# Patient Record
Sex: Female | Born: 1965 | Race: Black or African American | Hispanic: No | Marital: Married
Health system: Midwestern US, Community
[De-identification: ages and names within clinical notes are randomized; demographics above are authoritative.]

## PROBLEM LIST (undated history)

## (undated) DIAGNOSIS — M5136 Other intervertebral disc degeneration, lumbar region: Secondary | ICD-10-CM

---

## 2016-04-23 ENCOUNTER — Encounter

## 2016-05-05 ENCOUNTER — Inpatient Hospital Stay: Payer: TRICARE (CHAMPUS) | Primary: Family Medicine

## 2016-05-05 DIAGNOSIS — M5136 Other intervertebral disc degeneration, lumbar region: Secondary | ICD-10-CM

## 2016-05-10 ENCOUNTER — Ambulatory Visit: Payer: TRICARE (CHAMPUS) | Primary: Family Medicine

## 2016-06-09 ENCOUNTER — Inpatient Hospital Stay: Admit: 2016-06-09 | Payer: TRICARE (CHAMPUS) | Primary: Family Medicine

## 2016-06-09 ENCOUNTER — Encounter

## 2016-06-09 DIAGNOSIS — M5136 Other intervertebral disc degeneration, lumbar region: Secondary | ICD-10-CM

## 2016-06-09 MED ORDER — SODIUM CHLORIDE 0.9 % IV
Freq: Once | INTRAVENOUS | Status: AC
Start: 2016-06-09 — End: 2016-06-09
  Administered 2016-06-09: 15:00:00 via INTRAVENOUS

## 2016-06-09 MED ORDER — FENTANYL CITRATE (PF) 50 MCG/ML IJ SOLN
50 mcg/mL | Freq: Once | INTRAMUSCULAR | Status: AC
Start: 2016-06-09 — End: 2016-06-09
  Administered 2016-06-09: 15:00:00 via INTRAVENOUS

## 2016-06-09 MED ORDER — ALPRAZOLAM 0.5 MG TAB
0.5 mg | Freq: Once | ORAL | Status: AC
Start: 2016-06-09 — End: 2016-06-09
  Administered 2016-06-09: 14:00:00 via ORAL

## 2016-06-09 MED ORDER — MIDAZOLAM 1 MG/ML IJ SOLN
1 mg/mL | Freq: Once | INTRAMUSCULAR | Status: AC
Start: 2016-06-09 — End: 2016-06-09
  Administered 2016-06-09: 15:00:00 via INTRAVENOUS

## 2016-06-09 MED FILL — MIDAZOLAM 1 MG/ML IJ SOLN: 1 mg/mL | INTRAMUSCULAR | Qty: 5

## 2016-06-09 MED FILL — ALPRAZOLAM 0.5 MG TAB: 0.5 mg | ORAL | Qty: 1

## 2016-06-09 MED FILL — FENTANYL CITRATE (PF) 50 MCG/ML IJ SOLN: 50 mcg/mL | INTRAMUSCULAR | Qty: 2

## 2016-06-09 MED FILL — SODIUM CHLORIDE 0.9 % IV: INTRAVENOUS | Qty: 500

## 2016-06-09 NOTE — Progress Notes (Signed)
The patient was monitored by the pulse ox and nurse at side. She had continuous oxygen at 2 liters per nasal canula. She tolerated the mri well without any repeat of images. Omari Mcmanaway RN
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
## 2016-06-09 NOTE — Progress Notes (Signed)
The patient was sedated with xanax 0.5 mg po, versed 2.5 mg iv and fentanyl 50 mcg iv. She was discharged in a wheelchair with written discharge instructions after eating and drinking. She tolerated the mri well and was happy with the procedure. Micael HampshirePatricia Avary Pitsenbarger RN

## 2016-06-09 NOTE — H&P (Signed)
Interventional Radiology History and Physical (Outpatient)    06/09/2016    Patient: Elon SpannerNancy Caprio 51 y.o. female     Referring Physician:  Idelle CrouchSnyder, John W, MD    Chief Complaint: claustrophobia    History of Present Illness: needs mri spine with iv sedation    History:  Past Medical History:   Diagnosis Date   ??? Chronic pain    ??? Hypertension    ??? Migraine      No family history on file.  Social History     Social History   ??? Marital status: MARRIED     Spouse name: N/A   ??? Number of children: N/A   ??? Years of education: N/A     Occupational History   ??? Not on file.     Social History Main Topics   ??? Smoking status: Never Smoker   ??? Smokeless tobacco: Not on file   ??? Alcohol use Not on file   ??? Drug use: Not on file   ??? Sexual activity: Not on file     Other Topics Concern   ??? Not on file     Social History Narrative   ??? No narrative on file       Allergies: No Known Allergies    Prior to Admission Medications:  Prior to Admission medications    Medication Sig Start Date End Date Taking? Authorizing Provider   fluticasone (FLONASE) 50 mcg/actuation nasal spray 2 Sprays by Both Nostrils route daily.   Yes Historical Provider   telmisartan-hydroCHLOROthiazide (MICARDIS HCT) 80-25 mg per tablet Take 1 Tab by mouth daily.   Yes Historical Provider   sertraline (ZOLOFT) 100 mg tablet Take 100 mg by mouth daily.   Yes Historical Provider   ASPIRIN/SALICYLAMIDE/CAFFEINE (BC HEADACHE POWDER PO) Take  by mouth. Pt states she takes 3-4 a day.   Yes Historical Provider       Physical Exam:    Temperature 98.2 ??F (36.8 ??C), resp. rate 15, height 4\' 11"  (1.499 m), weight 67.1 kg (148 lb), SpO2 99 %.  General: alert, cooperative, no distress, appears stated age  Heart: normal S1 and S2  Lungs: clear to auscultation bilaterally      Plan of Care/Planned Procedure:  Risks, benefits, and alternatives reviewed with patient and she agrees to proceed with the procedure.     Cherre Robinsurner M Stefanee Mckell, MD

## 2016-06-25 ENCOUNTER — Inpatient Hospital Stay: Payer: TRICARE (CHAMPUS) | Primary: Family Medicine
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
